# Patient Record
Sex: Male | Born: 1980 | Race: Black or African American | Hispanic: No | Marital: Single | Smoking: Never smoker
Health system: Southern US, Community
[De-identification: ages and names within clinical notes are randomized; demographics above are authoritative.]

## PROBLEM LIST (undated history)

## (undated) DIAGNOSIS — Z789 Other specified health status: Secondary | ICD-10-CM

---

## 2004-09-14 ENCOUNTER — Emergency Department (HOSPITAL_COMMUNITY): Admission: EM | Admit: 2004-09-14 | Discharge: 2004-09-14 | Payer: Self-pay | Admitting: Emergency Medicine

## 2010-11-09 ENCOUNTER — Emergency Department (HOSPITAL_COMMUNITY)
Admission: EM | Admit: 2010-11-09 | Discharge: 2010-11-09 | Payer: Self-pay | Source: Home / Self Care | Admitting: Emergency Medicine

## 2010-11-12 ENCOUNTER — Emergency Department (HOSPITAL_COMMUNITY)
Admission: EM | Admit: 2010-11-12 | Discharge: 2010-11-12 | Payer: Self-pay | Source: Home / Self Care | Admitting: Emergency Medicine

## 2010-11-12 LAB — DIFFERENTIAL
Basophils Absolute: 0 10*3/uL (ref 0.0–0.1)
Basophils Relative: 0 % (ref 0–1)
Eosinophils Absolute: 0.1 10*3/uL (ref 0.0–0.7)
Eosinophils Relative: 0 % (ref 0–5)
Lymphocytes Relative: 10 % — ABNORMAL LOW (ref 12–46)
Lymphs Abs: 1.6 10*3/uL (ref 0.7–4.0)
Monocytes Absolute: 1.5 10*3/uL — ABNORMAL HIGH (ref 0.1–1.0)
Monocytes Relative: 9 % (ref 3–12)
Neutro Abs: 13.8 10*3/uL — ABNORMAL HIGH (ref 1.7–7.7)
Neutrophils Relative %: 81 % — ABNORMAL HIGH (ref 43–77)

## 2010-11-12 LAB — CBC
HCT: 42.8 % (ref 39.0–52.0)
Hemoglobin: 15.5 g/dL (ref 13.0–17.0)
MCH: 32.1 pg (ref 26.0–34.0)
MCHC: 36.2 g/dL — ABNORMAL HIGH (ref 30.0–36.0)
MCV: 88.6 fL (ref 78.0–100.0)
Platelets: 229 10*3/uL (ref 150–400)
RBC: 4.83 MIL/uL (ref 4.22–5.81)
RDW: 11.5 % (ref 11.5–15.5)
WBC: 17.1 10*3/uL — ABNORMAL HIGH (ref 4.0–10.5)

## 2010-12-03 NOTE — Consult Note (Addendum)
NAMEJAMEIL, Spencer Frazier             ACCOUNT NO.:  [id number]  MEDICAL RECORD NO.:  [id number]          PATIENT TYPE:  EMS  LOCATION:  [facility]                         FACILITY:  [facility]  PHYSICIAN:  [name], MD     DATE OF BIRTH:  07/13/1981  DATE OF CONSULTATION:  11/12/2010 DATE OF DISCHARGE:  11/12/2010                                CONSULTATION   REASON FOR CONSULTATION:  Sore throat, possible peritonsillar abscess.  HISTORY:  This is a 30 year old who started having trouble about a week and half ago with sore throat on the left side.  It started getting more severe on the left side on Friday.  About 2 days ago, it started getting a little better on the left and then started getting much worse on the right side.  He came to emergency department, Monday.  He was given a azithromycin and was sent home.  He returned today because he was getting worse.  A CT scan was performed and this revealed findings suspicious for peritonsillar abscess.  No history of abscess prior to this.  He is otherwise in good health, is on no chronic medications and does not smoke.  PHYSICAL EXAMINATION:  GENERAL:  A healthy-appearing young man.  He is very slow to talk because of the sore throat, but when he does talk he is relatively easy to understand.  His voice is not muffled.  He simply is afraid to use his voice and tries whispering which does not work very well.  But when he does make the effort and talk, he is comprehensible. HEENT:  There is no palpable neck masses, but there is a slight fullness and tenderness at the right upper jugular lymph node chain.  There is no external swelling or erythema seen.  Nasal exam unremarkable.  Oral cavity and pharynx reveals large tonsils bilaterally, but asymmetric with much larger tonsil on the right.  The tonsil itself has white exudate on the surface on both sides.  There are deep cryptic spaces seen.  The soft palate is slightly full on the right, but  it is by palpation is nontender and it appears to be larger simply because of the larger tonsil.  Does not feel consistent with an abscess.  CT scan reviewed.  The CT report describes an air-fluid level and an abscess inside the right tonsil, but on more careful inspection, I think this is actually a very deep crypt with some secretions inside as there appears to be an opening that opens up more superiorly.  IMPRESSION:  Acute severe tonsillar pharyngitis.  This may be viral.  I would recommend however we change the antibiotic to a more appropriate upper aerodigestive tract antibiotic such as Augmentin for 10 days.  He is instructed to follow up with me in early next week or sooner if he gets any worse.  I explained to him that if he is unable to drink adequate amounts of liquid to keep himself well-hydrated that he should contact me right away, I will admit him to the hospital for intravenous support.  If he gets any worse, then will check him again to  see if may be an abscess has developed.  Otherwise, he will follow up next week in the office.     [name], MD     JHR/MEDQ  D:  11/12/2010  T:  11/13/2010  Job:  [id number]  Electronically Signed by Serena Colonel MD on 12/03/2010 10:46:30 AM

## 2011-05-23 ENCOUNTER — Emergency Department (HOSPITAL_COMMUNITY): Payer: PRIVATE HEALTH INSURANCE

## 2011-05-23 ENCOUNTER — Inpatient Hospital Stay (HOSPITAL_COMMUNITY)
Admission: EM | Admit: 2011-05-23 | Discharge: 2011-05-25 | DRG: 605 | Disposition: A | Discharge status: Home / Self Care | Payer: PRIVATE HEALTH INSURANCE | Attending: Surgery | Admitting: Surgery

## 2011-05-23 DIAGNOSIS — S41109A Unspecified open wound of unspecified upper arm, initial encounter: Secondary | ICD-10-CM | POA: Diagnosis present

## 2011-05-23 DIAGNOSIS — S01309A Unspecified open wound of unspecified ear, initial encounter: Secondary | ICD-10-CM | POA: Diagnosis present

## 2011-05-23 DIAGNOSIS — S0180XA Unspecified open wound of other part of head, initial encounter: Principal | ICD-10-CM | POA: Diagnosis present

## 2011-05-23 DIAGNOSIS — D62 Acute posthemorrhagic anemia: Secondary | ICD-10-CM | POA: Diagnosis present

## 2011-05-23 LAB — COMPREHENSIVE METABOLIC PANEL
ALT: 29 U/L (ref 0–53)
AST: 35 U/L (ref 0–37)
Albumin: 3.3 g/dL — ABNORMAL LOW (ref 3.5–5.2)
Alkaline Phosphatase: 59 U/L (ref 39–117)
BUN: 8 mg/dL (ref 6–23)
CO2: 25 mEq/L (ref 19–32)
Calcium: 8 mg/dL — ABNORMAL LOW (ref 8.4–10.5)
Chloride: 107 mEq/L (ref 96–112)
Creatinine, Ser: 1.15 mg/dL (ref 0.50–1.35)
GFR calc Af Amer: >60 mL/min (ref >60)
GFR calc non Af Amer: >60 mL/min (ref >60)
Glucose, Bld: 151 mg/dL — ABNORMAL HIGH (ref 70–99)
Potassium: 2.9 mEq/L — ABNORMAL LOW (ref 3.5–5.1)
Sodium: 143 mEq/L (ref 135–145)
Total Bilirubin: 0.2 mg/dL — ABNORMAL LOW (ref 0.3–1.2)
Total Protein: 6.1 g/dL (ref 6.0–8.3)

## 2011-05-23 LAB — CBC
HCT: 34.3 % — ABNORMAL LOW (ref 39.0–52.0)
HCT: 27 % — ABNORMAL LOW (ref 39.0–52.0)
Hemoglobin: 12.4 g/dL — ABNORMAL LOW (ref 13.0–17.0)
Hemoglobin: 9.8 g/dL — ABNORMAL LOW (ref 13.0–17.0)
MCH: 32 pg (ref 26.0–34.0)
MCH: 31.5 pg (ref 26.0–34.0)
MCHC: 36.2 g/dL — ABNORMAL HIGH (ref 30.0–36.0)
MCHC: 36.3 g/dL — ABNORMAL HIGH (ref 30.0–36.0)
MCV: 88.4 fL (ref 78.0–100.0)
MCV: 86.8 fL (ref 78.0–100.0)
Platelets: 184 10*3/uL (ref 150–400)
Platelets: 100 10*3/uL — ABNORMAL LOW (ref 150–400)
RBC: 3.88 MIL/uL — ABNORMAL LOW (ref 4.22–5.81)
RBC: 3.11 MIL/uL — ABNORMAL LOW (ref 4.22–5.81)
RDW: 11.8 % (ref 11.5–15.5)
RDW: 12.6 % (ref 11.5–15.5)
WBC: 7.2 10*3/uL (ref 4.0–10.5)
WBC: 10 10*3/uL (ref 4.0–10.5)

## 2011-05-23 LAB — HEMOGLOBIN: Hemoglobin: 12 g/dL — ABNORMAL LOW (ref 13.0–17.0)

## 2011-05-23 LAB — PROTIME-INR
INR: 1.15 (ref 0.00–1.49)
INR: 1.33 (ref 0.00–1.49)
Prothrombin Time: 14.9 seconds (ref 11.6–15.2)
Prothrombin Time: 16.7 s — ABNORMAL HIGH (ref 11.6–15.2)

## 2011-05-23 LAB — POCT I-STAT, CHEM 8
BUN: 7 mg/dL (ref 6–23)
Calcium, Ion: 1.06 mmol/L — ABNORMAL LOW (ref 1.12–1.32)
Chloride: 106 meq/L (ref 96–112)
Creatinine, Ser: 1.5 mg/dL — ABNORMAL HIGH (ref 0.50–1.35)
Glucose, Bld: 144 mg/dL — ABNORMAL HIGH (ref 70–99)
HCT: 36 % — ABNORMAL LOW (ref 39.0–52.0)
Hemoglobin: 12.2 g/dL — ABNORMAL LOW (ref 13.0–17.0)
Potassium: 3 meq/L — ABNORMAL LOW (ref 3.5–5.1)
Sodium: 146 meq/L — ABNORMAL HIGH (ref 135–145)
TCO2: 21 mmol/L (ref 0–100)

## 2011-05-23 LAB — LACTIC ACID, PLASMA: Lactic Acid, Venous: 5 mmol/L — ABNORMAL HIGH (ref 0.5–2.2)

## 2011-05-23 LAB — APTT: aPTT: 30 s (ref 24–37)

## 2011-05-23 LAB — ABO/RH: ABO/RH(D): O POS

## 2011-05-23 LAB — MRSA PCR SCREENING: MRSA by PCR: NEGATIVE

## 2011-05-23 LAB — HEMATOCRIT: HCT: 32.3 % — ABNORMAL LOW (ref 39.0–52.0)

## 2011-05-24 DIAGNOSIS — H02409 Unspecified ptosis of unspecified eyelid: Secondary | ICD-10-CM

## 2011-05-24 LAB — PREPARE FRESH FROZEN PLASMA
Unit division: 0
Unit division: 0

## 2011-05-24 LAB — CBC
HCT: 26.3 % — ABNORMAL LOW (ref 39.0–52.0)
Hemoglobin: 9.8 g/dL — ABNORMAL LOW (ref 13.0–17.0)
MCH: 31.4 pg (ref 26.0–34.0)
MCHC: 37.3 g/dL — ABNORMAL HIGH (ref 30.0–36.0)
MCV: 84.3 fL (ref 78.0–100.0)
Platelets: 105 10*3/uL — ABNORMAL LOW (ref 150–400)
RBC: 3.12 MIL/uL — ABNORMAL LOW (ref 4.22–5.81)
RDW: 13.5 % (ref 11.5–15.5)
WBC: 11.4 10*3/uL — ABNORMAL HIGH (ref 4.0–10.5)

## 2011-05-24 LAB — BASIC METABOLIC PANEL
BUN: 8 mg/dL (ref 6–23)
CO2: 28 mEq/L (ref 19–32)
Calcium: 8 mg/dL — ABNORMAL LOW (ref 8.4–10.5)
Chloride: 105 mEq/L (ref 96–112)
Creatinine, Ser: 0.91 mg/dL (ref 0.50–1.35)
GFR calc Af Amer: >60 mL/min (ref >60)
GFR calc non Af Amer: >60 mL/min (ref >60)
Glucose, Bld: 119 mg/dL — ABNORMAL HIGH (ref 70–99)
Potassium: 3.8 mEq/L (ref 3.5–5.1)
Sodium: 139 mEq/L (ref 135–145)

## 2011-05-26 LAB — POCT I-STAT 4, (NA,K, GLUC, HGB,HCT)
Glucose, Bld: 160 mg/dL — ABNORMAL HIGH (ref 70–99)
Glucose, Bld: 154 mg/dL — ABNORMAL HIGH (ref 70–99)
HCT: 25 % — ABNORMAL LOW (ref 39.0–52.0)
HCT: 25 % — ABNORMAL LOW (ref 39.0–52.0)
Hemoglobin: 8.5 g/dL — ABNORMAL LOW (ref 13.0–17.0)
Hemoglobin: 8.5 g/dL — ABNORMAL LOW (ref 13.0–17.0)
Potassium: 4 meq/L (ref 3.5–5.1)
Potassium: 4.3 meq/L (ref 3.5–5.1)
Sodium: 145 meq/L (ref 135–145)
Sodium: 144 meq/L (ref 135–145)

## 2011-05-26 LAB — TYPE AND SCREEN
ABO/RH(D): O POS
Antibody Screen: NEGATIVE
Unit division: 0
Unit division: 0
Unit division: 0
Unit division: 0
Unit division: 0
Unit division: 0
Unit division: 0
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
## 2011-06-03 ENCOUNTER — Ambulatory Visit (INDEPENDENT_AMBULATORY_CARE_PROVIDER_SITE_OTHER): Payer: PRIVATE HEALTH INSURANCE | Admitting: Physician Assistant

## 2011-06-03 DIAGNOSIS — S0181XA Laceration without foreign body of other part of head, initial encounter: Secondary | ICD-10-CM

## 2011-06-03 DIAGNOSIS — S0180XA Unspecified open wound of other part of head, initial encounter: Secondary | ICD-10-CM

## 2011-06-03 DIAGNOSIS — S41119A Laceration without foreign body of unspecified upper arm, initial encounter: Secondary | ICD-10-CM

## 2011-06-03 DIAGNOSIS — S41109A Unspecified open wound of unspecified upper arm, initial encounter: Secondary | ICD-10-CM
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
## 2011-07-09 ENCOUNTER — Other Ambulatory Visit (HOSPITAL_COMMUNITY): Payer: Self-pay | Admitting: Otolaryngology

## 2011-07-09 DIAGNOSIS — S0181XA Laceration without foreign body of other part of head, initial encounter: Secondary | ICD-10-CM

## 2011-07-09 DIAGNOSIS — K114 Fistula of salivary gland: Secondary | ICD-10-CM

## 2011-07-13 ENCOUNTER — Ambulatory Visit (HOSPITAL_COMMUNITY)
Admission: RE | Admit: 2011-07-13 | Discharge: 2011-07-13 | Disposition: A | Discharge status: Home / Self Care | Payer: PRIVATE HEALTH INSURANCE | Source: Ambulatory Visit | Attending: Otolaryngology | Admitting: Otolaryngology

## 2011-07-13 DIAGNOSIS — S0181XA Laceration without foreign body of other part of head, initial encounter: Secondary | ICD-10-CM

## 2011-07-13 DIAGNOSIS — K114 Fistula of salivary gland: Secondary | ICD-10-CM

## 2011-07-26 ENCOUNTER — Ambulatory Visit (HOSPITAL_COMMUNITY)
Admission: RE | Admit: 2011-07-26 | Discharge: 2011-07-26 | Disposition: A | Discharge status: Home / Self Care | Payer: PRIVATE HEALTH INSURANCE | Source: Ambulatory Visit | Attending: Otolaryngology | Admitting: Otolaryngology

## 2011-07-26 ENCOUNTER — Other Ambulatory Visit: Payer: Self-pay | Admitting: Otolaryngology

## 2011-07-26 ENCOUNTER — Ambulatory Visit (HOSPITAL_COMMUNITY): Admission: RE | Admit: 2011-07-26 | Payer: PRIVATE HEALTH INSURANCE | Source: Ambulatory Visit

## 2011-07-26 DIAGNOSIS — R519 Headache, unspecified: Secondary | ICD-10-CM

## 2011-07-26 DIAGNOSIS — Z09 Encounter for follow-up examination after completed treatment for conditions other than malignant neoplasm: Secondary | ICD-10-CM

## 2011-07-26 DIAGNOSIS — IMO0002 Reserved for concepts with insufficient information to code with codable children: Secondary | ICD-10-CM

## 2011-07-26 DIAGNOSIS — R51 Headache: Secondary | ICD-10-CM | POA: Insufficient documentation

## 2011-07-26 MED ORDER — IOHEXOL 300 MG/ML  SOLN
3.0000 mL | Freq: Once | INTRAMUSCULAR | Status: AC | PRN
  Administered 2011-07-26: 3 mL

## 2011-08-04 ENCOUNTER — Other Ambulatory Visit (HOSPITAL_COMMUNITY): Payer: PRIVATE HEALTH INSURANCE

## 2012-11-02 ENCOUNTER — Emergency Department (HOSPITAL_COMMUNITY): Payer: BC Managed Care – PPO

## 2012-11-02 ENCOUNTER — Inpatient Hospital Stay (HOSPITAL_COMMUNITY)
Admission: EM | Admit: 2012-11-02 | Discharge: 2012-11-03 | DRG: 765 | Disposition: A | Discharge status: Home / Self Care | Payer: BC Managed Care – PPO | Attending: Emergency Medicine | Admitting: Emergency Medicine

## 2012-11-02 ENCOUNTER — Encounter (HOSPITAL_COMMUNITY): Payer: Self-pay | Admitting: Emergency Medicine

## 2012-11-02 DIAGNOSIS — R0609 Other forms of dyspnea: Secondary | ICD-10-CM | POA: Diagnosis present

## 2012-11-02 DIAGNOSIS — J95821 Acute postprocedural respiratory failure: Secondary | ICD-10-CM

## 2012-11-02 DIAGNOSIS — S0180XA Unspecified open wound of other part of head, initial encounter: Secondary | ICD-10-CM | POA: Diagnosis present

## 2012-11-02 DIAGNOSIS — S01119A Laceration without foreign body of unspecified eyelid and periocular area, initial encounter: Secondary | ICD-10-CM | POA: Diagnosis present

## 2012-11-02 DIAGNOSIS — J9690 Respiratory failure, unspecified, unspecified whether with hypoxia or hypercapnia: Secondary | ICD-10-CM

## 2012-11-02 DIAGNOSIS — IMO0002 Reserved for concepts with insufficient information to code with codable children: Secondary | ICD-10-CM

## 2012-11-02 DIAGNOSIS — S0181XA Laceration without foreign body of other part of head, initial encounter: Secondary | ICD-10-CM | POA: Diagnosis present

## 2012-11-02 DIAGNOSIS — S060X9A Concussion with loss of consciousness of unspecified duration, initial encounter: Principal | ICD-10-CM | POA: Diagnosis present

## 2012-11-02 DIAGNOSIS — R0989 Other specified symptoms and signs involving the circulatory and respiratory systems: Secondary | ICD-10-CM | POA: Diagnosis present

## 2012-11-02 DIAGNOSIS — S0990XA Unspecified injury of head, initial encounter: Secondary | ICD-10-CM

## 2012-11-02 HISTORY — DX: Other specified health status: Z78.9

## 2012-11-02 LAB — BLOOD GAS, ARTERIAL
Acid-Base Excess: 0.6 mmol/L (ref 0.0–2.0)
Bicarbonate: 24.2 mEq/L — ABNORMAL HIGH (ref 20.0–24.0)
Drawn by: 36494
FIO2: 0.5 %
O2 Saturation: 99.3 %
RATE: 16 resp/min
pCO2 arterial: 35.6 mmHg (ref 35.0–45.0)
pO2, Arterial: 170 mmHg — ABNORMAL HIGH (ref 80.0–100.0)

## 2012-11-02 LAB — PROTIME-INR: Prothrombin Time: 12.9 seconds (ref 11.6–15.2)

## 2012-11-02 LAB — CBC
Hemoglobin: 15.4 g/dL (ref 13.0–17.0)
MCHC: 34.5 g/dL (ref 30.0–36.0)
RBC: 5 MIL/uL (ref 4.22–5.81)

## 2012-11-02 LAB — URINALYSIS, MICROSCOPIC ONLY
Glucose, UA: NEGATIVE mg/dL
Leukocytes, UA: NEGATIVE
Nitrite: NEGATIVE
Protein, ur: NEGATIVE mg/dL

## 2012-11-02 LAB — COMPREHENSIVE METABOLIC PANEL
ALT: 36 U/L (ref 0–53)
CO2: 24 mEq/L (ref 19–32)
Calcium: 9.2 mg/dL (ref 8.4–10.5)
Creatinine, Ser: 0.92 mg/dL (ref 0.50–1.35)
GFR calc Af Amer: >90 mL/min (ref >90)
GFR calc non Af Amer: >90 mL/min (ref >90)
Glucose, Bld: 126 mg/dL — ABNORMAL HIGH (ref 70–99)

## 2012-11-02 LAB — POCT I-STAT, CHEM 8
BUN: 10 mg/dL (ref 6–23)
Chloride: 106 mEq/L (ref 96–112)
Creatinine, Ser: 1.4 mg/dL — ABNORMAL HIGH (ref 0.50–1.35)
Potassium: 3.8 mEq/L (ref 3.5–5.1)
Sodium: 147 mEq/L — ABNORMAL HIGH (ref 135–145)

## 2012-11-02 MED ORDER — MIDAZOLAM HCL 5 MG/5ML IJ SOLN
INTRAMUSCULAR | Status: AC | PRN
  Administered 2012-11-02 (×2): 2 mg via INTRAVENOUS

## 2012-11-02 MED ORDER — SUCCINYLCHOLINE CHLORIDE 20 MG/ML IJ SOLN
INTRAMUSCULAR | Status: AC | PRN
  Administered 2012-11-02: 120 mg via INTRAVENOUS

## 2012-11-02 MED ORDER — LIDOCAINE HCL (CARDIAC) 20 MG/ML IV SOLN
INTRAVENOUS | Status: AC | PRN
  Administered 2012-11-02: 100 mg via INTRAVENOUS

## 2012-11-02 MED ORDER — FENTANYL CITRATE 0.05 MG/ML IJ SOLN
INTRAMUSCULAR | Status: DC | PRN
  Administered 2012-11-02 (×2): 100 ug via INTRAVENOUS

## 2012-11-02 MED ORDER — FENTANYL BOLUS VIA INFUSION
25.0000 ug | Freq: Four times a day (QID) | INTRAVENOUS | Status: DC | PRN
  Administered 2012-11-02: 50 ug
  Filled 2012-11-02: qty 100

## 2012-11-02 MED ORDER — SODIUM CHLORIDE 0.9 % IV SOLN: 1.0000 mg/h | INTRAVENOUS | Status: DC

## 2012-11-02 MED ORDER — MIDAZOLAM BOLUS VIA INFUSION
1.0000 mg | INTRAVENOUS | Status: DC | PRN
  Administered 2012-11-02: 2 mg

## 2012-11-02 MED ORDER — ONDANSETRON HCL 4 MG PO TABS: 4.0000 mg | ORAL_TABLET | Freq: Four times a day (QID) | ORAL | Status: DC | PRN

## 2012-11-02 MED ORDER — SODIUM CHLORIDE 0.9 % IV SOLN
INTRAVENOUS | Status: AC | PRN
  Administered 2012-11-02: 1000 mL via INTRAVENOUS
  Administered 2012-11-02: 10 mL via INTRAVENOUS
  Administered 2012-11-02: 1000 mL via INTRAVENOUS

## 2012-11-02 MED ORDER — PANTOPRAZOLE SODIUM 40 MG IV SOLR
40.0000 mg | Freq: Every day | INTRAVENOUS | Status: DC
  Administered 2012-11-02: 40 mg via INTRAVENOUS
  Filled 2012-11-02: qty 40

## 2012-11-02 MED ORDER — ONDANSETRON HCL 4 MG/2ML IJ SOLN: 4.0000 mg | Freq: Four times a day (QID) | INTRAMUSCULAR | Status: DC | PRN

## 2012-11-02 MED ORDER — SODIUM CHLORIDE 0.9 % IV SOLN
INTRAVENOUS | Status: DC
  Administered 2012-11-02: 07:00:00 via INTRAVENOUS

## 2012-11-02 MED ORDER — SODIUM CHLORIDE 0.9 % IJ SOLN: 3.0000 mL | INTRAMUSCULAR | Status: DC | PRN

## 2012-11-02 MED ORDER — SODIUM CHLORIDE 0.9 % IV SOLN
25.0000 ug/h | INTRAVENOUS | Status: DC
  Administered 2012-11-02 (×2): 50 ug/h via INTRAVENOUS
  Administered 2012-11-02: 100 ug/h via INTRAVENOUS
  Filled 2012-11-02: qty 50

## 2012-11-02 MED ORDER — ETOMIDATE 2 MG/ML IV SOLN
INTRAVENOUS | Status: AC | PRN
  Administered 2012-11-02: 20 mg via INTRAVENOUS

## 2012-11-02 MED ORDER — MIDAZOLAM HCL 5 MG/ML IJ SOLN
1.0000 mg/h | INTRAMUSCULAR | Status: DC
  Administered 2012-11-02: 2 mg/h via INTRAVENOUS
  Administered 2012-11-02: 1 mg/h via INTRAVENOUS
  Filled 2012-11-02: qty 10

## 2012-11-02 MED ORDER — MIDAZOLAM BOLUS VIA INFUSION
1.0000 mg | INTRAVENOUS | Status: DC | PRN
  Filled 2012-11-02: qty 2

## 2012-11-02 MED ORDER — PANTOPRAZOLE SODIUM 40 MG PO TBEC
40.0000 mg | DELAYED_RELEASE_TABLET | Freq: Every day | ORAL | Status: DC
  Administered 2012-11-03: 40 mg via ORAL
  Filled 2012-11-02: qty 1

## 2012-11-02 MED ORDER — SODIUM CHLORIDE 0.9 % IV SOLN
25.0000 ug/h | INTRAVENOUS | Status: DC
  Filled 2012-11-02: qty 50

## 2012-11-02 MED ORDER — OXYCODONE-ACETAMINOPHEN 5-325 MG PO TABS
1.0000 | ORAL_TABLET | ORAL | Status: DC | PRN
  Administered 2012-11-03: 1 via ORAL
  Filled 2012-11-02: qty 2

## 2012-11-02 NOTE — Procedures (Signed)
Facial laceration repair  Indications:  Clinically significant bleeding with lacerations  Pre-operative Diagnosis: hemorrhage from facial lacerations  Post-operative Diagnosis: saa  Procedure Details  There was no informed consent due to patient being intubated and no family members available.  The lacerations were bleeding briskly so I needed to repair.  I did not think ok to wait for ent/face to come in either.  After sterile skin prep and washout of wounds, I trimmed some dead edges.  There were two lacerations superior to the right orbit.  One was 1.5 cm and the other was a 3 cm stellate laceration with abrasion around it. I repaired the 1.5 cm laceration with interrupted 4-0 prolene after infiltration with lidocaine.  I then trimmed the other wound and repaired the deep layer with 4-0 vicryl and the skin with 4-0 prolene.  There is fairly large hematoma underlying this that was present prior to repair. The lower lid lesion is about 3.5 cm and is about 6 mm from edge of lid.  This was actively bleeding.  That patient is 94 degrees right now also.  I repaired the deep layer with vicryl.  This did not enter into globe. There were two active bleeders that I oversewed.  I then approximated the skin with prolene.  I was concerned this would change the geometry of the lower lid but I think it will be reasonable.    Estimated Blood Loss:  50 cc         Specimens:  None              Complications:  None; patient tolerated the procedure well.         Disposition: ICU - intubated and hemodynamically stable.         Condition: stable

## 2012-11-02 NOTE — Progress Notes (Signed)
UR completed 

## 2012-11-02 NOTE — Progress Notes (Signed)
Follow up - Trauma and Critical Care  Patient Details:    Spencer Frazier is an 31 y.o. male.  Lines/tubes : Airway 7.5 mm (Active)  Secured at (cm) 24 cm 11/02/2012  7:30 AM  Measured From Teeth 11/02/2012  7:30 AM  Secured Location Right 11/02/2012  7:30 AM  Secured By [employer] 11/02/2012  7:30 AM  Tube Holder Repositioned Yes 11/02/2012  7:30 AM  Site Condition Cool;Dry 11/02/2012  7:30 AM     NG/OG Tube Orogastric 16 Fr. Center mouth (Active)  Placement Verification Auscultation;Xray 11/02/2012  8:00 AM  Site Assessment Clean;Intact 11/02/2012  8:00 AM  Status Clamped 11/02/2012  8:00 AM  Gastric Residual 0 mL 11/02/2012  8:00 AM     Urethral Catheter Latex 16 Fr. (Active)  Indication for Insertion or Continuance of Catheter Prolonged immobilization 11/02/2012  8:00 AM  Site Assessment Clean;Intact 11/02/2012  8:00 AM  Collection Container Standard drainage bag 11/02/2012  8:00 AM  Securement Method Leg strap 11/02/2012  8:00 AM    Microbiology/Sepsis markers: Results for orders placed during the hospital encounter of 11/02/12  MRSA PCR SCREENING     Status: Normal   Collection Time   11/02/12  6:38 AM      Component Value Range Status Comment   MRSA by PCR NEGATIVE  NEGATIVE Final     Anti-infectives:  Anti-infectives    None      Best Practice/Protocols:  VTE Prophylaxis: Mechanical GI Prophylaxis: Proton Pump Inhibitor None currently but may be placed on alcohol withdrawal protocol  Consults:      Events:  Subjective:    Overnight Issues: Just came to the ICU abotu two hours ago.  Objective:  Vital signs for last 24 hours: Temp:  [94.3 F (34.6 C)-99 F (37.2 C)] 99 F (37.2 C) (12/26 0800) Pulse Rate:  [69-109] 83  (12/26 0800) Resp:  [16-25] 16  (12/26 0800) BP: (110-197)/(70-116) 135/81 mmHg (12/26 0800) SpO2:  [78 %-100 %] 100 % (12/26 0800) FiO2 (%):  [50 %] 50 % (12/26 0800)  Hemodynamic parameters for last 24  hours:    Intake/Output from previous day: 12/25 0701 - 12/26 0700 In: 4805.9 [I.V.:4805.9] Out: 160 [Urine:160]  Intake/Output this shift: Total I/O In: 100 [I.V.:100] Out: 125 [Urine:125]  Vent settings for last 24 hours: Vent Mode:  [-] PRVC FiO2 (%):  [50 %] 50 % Set Rate:  [16 bmp] 16 bmp Vt Set:  [550 mL] 550 mL PEEP:  [5 cmH20] 5 cmH20 Plateau Pressure:  [14 cmH20] 14 cmH20  Physical Exam:  General: no respiratory distress and not alert but responsive. Neuro: oriented, nonfocal exam and RASS -1 HEENT/Neck: ETT WNL , PERRL and right infra-orbital laceration with likely skin loss and slightly protuberant. Resp: clear to auscultation bilaterally CVS: regular rate and rhythm, S1, S2 normal, no murmur, click, rub or gallop GI: soft, nontender, BS WNL, no r/g and patient states that abdomen is slightly tender. Extremities: no edema, no erythema, pulses WNL  Results for orders placed during the hospital encounter of 11/02/12 (from the past 24 hour(s))  SAMPLE TO BLOOD BANK     Status: Normal   Collection Time   11/02/12  2:45 AM      Component Value Range   Blood Bank Specimen SAMPLE AVAILABLE FOR TESTING     Sample Expiration 11/03/2012    CDS SEROLOGY     Status: Normal   Collection Time   11/02/12  2:46 AM  Component Value Range   CDS serology specimen       Value: SPECIMEN WILL BE HELD FOR 14 DAYS IF TESTING IS REQUIRED  COMPREHENSIVE METABOLIC PANEL     Status: Abnormal   Collection Time   11/02/12  2:46 AM      Component Value Range   Sodium 142  135 - 145 mEq/L   Potassium 3.9  3.5 - 5.1 mEq/L   Chloride 103  96 - 112 mEq/L   CO2 24  19 - 32 mEq/L   Glucose, Bld 126 (*) 70 - 99 mg/dL   BUN 11  6 - 23 mg/dL   Creatinine, Ser 1.61  0.50 - 1.35 mg/dL   Calcium 9.2  8.4 - 09.6 mg/dL   Total Protein 8.0  6.0 - 8.3 g/dL   Albumin 4.2  3.5 - 5.2 g/dL   AST 63 (*) 0 - 37 U/L   ALT 36  0 - 53 U/L   Alkaline Phosphatase 79  39 - 117 U/L   Total Bilirubin  0.3  0.3 - 1.2 mg/dL   GFR calc non Af Amer >90  >90 mL/min   GFR calc Af Amer >90  >90 mL/min  CBC     Status: Normal   Collection Time   11/02/12  2:46 AM      Component Value Range   WBC 6.9  4.0 - 10.5 K/uL   RBC 5.00  4.22 - 5.81 MIL/uL   Hemoglobin 15.4  13.0 - 17.0 g/dL   HCT 04.5  40.9 - 81.1 %   MCV 89.2  78.0 - 100.0 fL   MCH 30.8  26.0 - 34.0 pg   MCHC 34.5  30.0 - 36.0 g/dL   RDW 91.4  78.2 - 95.6 %   Platelets 194  150 - 400 K/uL  PROTIME-INR     Status: Normal   Collection Time   11/02/12  2:46 AM      Component Value Range   Prothrombin Time 12.9  11.6 - 15.2 seconds   INR 0.98  0.00 - 1.49  CG4 I-STAT (LACTIC ACID)     Status: Abnormal   Collection Time   11/02/12  2:59 AM      Component Value Range   Lactic Acid, Venous 5.91 (*) 0.5 - 2.2 mmol/L  POCT I-STAT, CHEM 8     Status: Abnormal   Collection Time   11/02/12  3:01 AM      Component Value Range   Sodium 147 (*) 135 - 145 mEq/L   Potassium 3.8  3.5 - 5.1 mEq/L   Chloride 106  96 - 112 mEq/L   BUN 10  6 - 23 mg/dL   Creatinine, Ser 2.13 (*) 0.50 - 1.35 mg/dL   Glucose, Bld 086 (*) 70 - 99 mg/dL   Calcium, Ion 5.78  4.69 - 1.23 mmol/L   TCO2 26  0 - 100 mmol/L   Hemoglobin 16.3  13.0 - 17.0 g/dL   HCT 62.9  52.8 - 41.3 %  URINALYSIS, MICROSCOPIC ONLY     Status: Abnormal   Collection Time   11/02/12  4:05 AM      Component Value Range   Color, Urine YELLOW  YELLOW   APPearance CLEAR  CLEAR   Specific Gravity, Urine 1.013  1.005 - 1.030   pH 6.5  5.0 - 8.0   Glucose, UA NEGATIVE  NEGATIVE mg/dL   Hgb urine dipstick TRACE (*) NEGATIVE   Bilirubin Urine NEGATIVE  NEGATIVE   Ketones, ur NEGATIVE  NEGATIVE mg/dL   Protein, ur NEGATIVE  NEGATIVE mg/dL   Urobilinogen, UA 0.2  0.0 - 1.0 mg/dL   Nitrite NEGATIVE  NEGATIVE   Leukocytes, UA NEGATIVE  NEGATIVE   RBC / HPF 0-2  <3 RBC/hpf   Bacteria, UA RARE  RARE   Squamous Epithelial / LPF RARE  RARE  MRSA PCR SCREENING     Status: Normal    Collection Time   11/02/12  6:38 AM      Component Value Range   MRSA by PCR NEGATIVE  NEGATIVE  BLOOD GAS, ARTERIAL     Status: Abnormal   Collection Time   11/02/12  7:00 AM      Component Value Range   FIO2 0.50     Delivery systems VENTILATOR     Mode PRESSURE REGULATED VOLUME CONTROL     VT 550     Rate 16     Peep/cpap 5.0     pH, Arterial 7.447  7.350 - 7.450   pCO2 arterial 35.6  35.0 - 45.0 mmHg   pO2, Arterial 170.0 (*) 80.0 - 100.0 mmHg   Bicarbonate 24.2 (*) 20.0 - 24.0 mEq/L   TCO2 25.3  0 - 100 mmol/L   Acid-Base Excess 0.6  0.0 - 2.0 mmol/L   O2 Saturation 99.3     Patient temperature 98.6     Collection site RIGHT RADIAL     Drawn by [phone]     Sample type ARTERIAL DRAW     Allens test (pass/fail) PASS  PASS     Assessment/Plan:   NEURO  Altered Mental Status:  concussion and awakening   Plan: Hold sedation as we try to get the patient extubated  PULM  No issues   Plan: CPM  CARDIO  No issues   Plan: CPM  RENAL  No issues   Plan: CPM  GI  Soft, but mildly tender.   Plan: Evaluated after extubation  ID  No apparent issues   Plan: CPM  HEME  No new issues   Plan: CPM  ENDO CPM   Plan: CPM  Global Issues  Wean to extubate and re-evaluate.    LOS: 0 days   Additional comments:Patient just up from the ED.  Will attempt to wean ASAP.  Critical Care Total Time*: 15 Minutes  Shirlyn Savin O 11/02/2012  *Care during the described time interval was provided by me and/or other providers on the critical care team.  I have reviewed this patient's available data, including medical history, events of note, physical examination and test results as part of my evaluation.
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
## 2012-11-02 NOTE — ED Notes (Signed)
Pt in radiology with RN accompanyment.
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
## 2012-11-02 NOTE — Progress Notes (Signed)
Wasted fentanyl and versed in sink since pt is no longer intubated. Wasted 20cc of versed and of fentanyl. Verified with another RN Verneda Skill.

## 2012-11-02 NOTE — Procedures (Signed)
Extubation Procedure Note  Patient Details:   Name: Spencer Frazier DOB: 10-14-1981 MRN: [id number]   Airway Documentation:     Evaluation  O2 sats: stable throughout Complications: No apparent complications Patient did tolerate procedure well. Bilateral Breath Sounds: Clear   Yes  Pt extubated to RA.  Pt Spo2 is 97%.  NIF -25, FVC 2.4L.  Cuff leak noted.  Pt has good cough and able to vocalize.  Pt has good cough.  Will continue to monitor.    Genia Del Apple 11/02/2012, 11:44 AM

## 2012-11-03 ENCOUNTER — Encounter (HOSPITAL_COMMUNITY): Payer: Self-pay

## 2012-11-03 DIAGNOSIS — S060X9A Concussion with loss of consciousness of unspecified duration, initial encounter: Secondary | ICD-10-CM | POA: Diagnosis present

## 2012-11-03 DIAGNOSIS — S060XAA Concussion with loss of consciousness status unknown, initial encounter: Secondary | ICD-10-CM | POA: Diagnosis present

## 2012-11-03 DIAGNOSIS — S0181XA Laceration without foreign body of other part of head, initial encounter: Secondary | ICD-10-CM | POA: Diagnosis present

## 2012-11-03 LAB — BASIC METABOLIC PANEL
BUN: 10 mg/dL (ref 6–23)
Chloride: 103 mEq/L (ref 96–112)
Creatinine, Ser: 0.83 mg/dL (ref 0.50–1.35)
GFR calc Af Amer: >90 mL/min (ref >90)
Glucose, Bld: 103 mg/dL — ABNORMAL HIGH (ref 70–99)
Potassium: 3.6 mEq/L (ref 3.5–5.1)

## 2012-11-03 LAB — CBC
Hemoglobin: 14.2 g/dL (ref 13.0–17.0)
MCV: 88.9 fL (ref 78.0–100.0)
Platelets: 163 10*3/uL (ref 150–400)
RBC: 4.51 MIL/uL (ref 4.22–5.81)
WBC: 6.9 10*3/uL (ref 4.0–10.5)

## 2012-11-03 MED ORDER — TRAMADOL HCL 50 MG PO TABS: 50.0000 mg | ORAL_TABLET | Freq: Four times a day (QID) | ORAL | Status: DC | PRN

## 2012-11-03 MED ORDER — NAPROXEN 500 MG PO TABS: 500.0000 mg | ORAL_TABLET | Freq: Two times a day (BID) | ORAL | Status: DC
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
## 2012-11-03 NOTE — Progress Notes (Signed)
Agree  With above  Home today

## 2012-11-09 ENCOUNTER — Ambulatory Visit (INDEPENDENT_AMBULATORY_CARE_PROVIDER_SITE_OTHER): Payer: BC Managed Care – PPO | Admitting: Orthopedic Surgery

## 2012-11-09 ENCOUNTER — Encounter (INDEPENDENT_AMBULATORY_CARE_PROVIDER_SITE_OTHER): Payer: Self-pay

## 2012-11-09 VITALS — BP 136/84 | HR 80 | Resp 16 | Ht 65.0 in | Wt 146.6 lb

## 2012-11-09 DIAGNOSIS — S0181XA Laceration without foreign body of other part of head, initial encounter: Secondary | ICD-10-CM

## 2012-11-09 DIAGNOSIS — S0180XA Unspecified open wound of other part of head, initial encounter: Secondary | ICD-10-CM

## 2012-11-09 DIAGNOSIS — M549 Dorsalgia, unspecified: Secondary | ICD-10-CM

## 2012-11-09 DIAGNOSIS — S6990XA Unspecified injury of unspecified wrist, hand and finger(s), initial encounter: Secondary | ICD-10-CM

## 2012-11-09 DIAGNOSIS — S6992XA Unspecified injury of left wrist, hand and finger(s), initial encounter: Secondary | ICD-10-CM
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
## 2012-11-10 ENCOUNTER — Ambulatory Visit (INDEPENDENT_AMBULATORY_CARE_PROVIDER_SITE_OTHER): Payer: BC Managed Care – PPO

## 2012-11-10 DIAGNOSIS — Z4802 Encounter for removal of sutures: Secondary | ICD-10-CM
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
## 2012-11-17 ENCOUNTER — Telehealth (HOSPITAL_COMMUNITY): Payer: Self-pay | Admitting: Orthopedic Surgery

## 2012-11-20 ENCOUNTER — Telehealth (HOSPITAL_COMMUNITY): Payer: Self-pay | Admitting: Orthopedic Surgery

## 2012-11-20 NOTE — Telephone Encounter (Signed)
Called 2 times, was told by person who answered that I had the wrong number.  Pt will need to call back with correct number.

## 2012-11-20 NOTE — Telephone Encounter (Signed)
Has another suture under eye. Will have patient come in this Thursday.

## 2012-11-23 ENCOUNTER — Ambulatory Visit (INDEPENDENT_AMBULATORY_CARE_PROVIDER_SITE_OTHER): Payer: BC Managed Care – PPO | Admitting: Orthopedic Surgery

## 2012-11-23 ENCOUNTER — Encounter (INDEPENDENT_AMBULATORY_CARE_PROVIDER_SITE_OTHER): Payer: Self-pay

## 2012-11-23 VITALS — BP 132/74 | HR 64 | Temp 97.8°F | Resp 16 | Ht 65.0 in | Wt 142.0 lb

## 2012-11-23 DIAGNOSIS — S0180XA Unspecified open wound of other part of head, initial encounter: Secondary | ICD-10-CM

## 2012-11-23 DIAGNOSIS — S6990XA Unspecified injury of unspecified wrist, hand and finger(s), initial encounter: Secondary | ICD-10-CM

## 2012-11-23 DIAGNOSIS — S0181XA Laceration without foreign body of other part of head, initial encounter: Secondary | ICD-10-CM

## 2012-11-23 DIAGNOSIS — S6992XA Unspecified injury of left wrist, hand and finger(s), initial encounter: Secondary | ICD-10-CM
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
## 2013-09-13 ENCOUNTER — Other Ambulatory Visit: Payer: Self-pay

## 2014-09-27 ENCOUNTER — Other Ambulatory Visit: Payer: Self-pay | Admitting: Emergency Medicine

## 2014-09-30 LAB — MEASLES/MUMPS/RUBELLA IMMUNITY
MUMPS IGG: 125 [AU]/ml — AB (ref <9.00)
RUBELLA: 12.9 {index} — AB (ref <0.90)
Rubeola IgG: 187 AU/mL — ABNORMAL HIGH (ref <25.00)
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
## 2016-03-26 ENCOUNTER — Ambulatory Visit (INDEPENDENT_AMBULATORY_CARE_PROVIDER_SITE_OTHER): Payer: BLUE CROSS/BLUE SHIELD | Admitting: Physician Assistant

## 2016-03-26 VITALS — BP 122/72 | HR 77 | Temp 98.3°F | Resp 17 | Ht 64.5 in | Wt 148.0 lb

## 2016-03-26 DIAGNOSIS — Z111 Encounter for screening for respiratory tuberculosis: Secondary | ICD-10-CM | POA: Diagnosis not present

## 2016-03-26 NOTE — Progress Notes (Signed)
  Tuberculosis Risk Questionnaire  1. NO  Were you born outside the [place] in one of the following parts of the world: [place], [place], [place], [place] or [place]?    2. No Have you traveled outside the [place] and lived for more than one month in one of the following parts of the world: [place], [place], [place], [place] or [place]?    3. No Do you have a compromised immune system such as from any of the following conditions:HIV/AIDS, organ or bone marrow transplantation, diabetes, immunosuppressive medicines (e.g. Prednisone, Remicaide), leukemia, lymphoma, cancer of the head or neck, gastrectomy or jejunal bypass, end-stage renal disease (on dialysis), or silicosis?     4. Yes MENTAL HEALTH Have you ever or do you plan on working in: a residential care center, a health care facility, a jail or prison or homeless shelter?    5. No Have you ever: injected illegal drugs, used crack cocaine, lived in a homeless shelter  or been in jail or prison?     6. No Have you ever been exposed to anyone with infectious tuberculosis?    Tuberculosis Symptom Questionnaire  Do you currently have any of the following symptoms?  1. No Unexplained cough lasting more than 3 weeks?   2. No Unexplained fever lasting more than 3 weeks.   3. No Night Sweats (sweating that leaves the bedclothes and sheets wet)     4. No Shortness of Breath   5. No Chest Pain   6. No Unintentional weight loss    7. No Unexplained fatigue (very tired for no reason)

## 2016-03-26 NOTE — Patient Instructions (Addendum)
Be sure to come to clinic Monday morning prior to 11am so we can check the site.   Tuberculin Skin Test WHY AM I HAVING THIS TEST? Tuberculosis (TB) is a bacterial infection caused by Mycobacterium tuberculosis. Most people who are exposed to these bacteria have a strong enough defense (immune) system to prevent the bacteria from causing TB and developing symptoms. Their bodies prevent the germs from being active and making them sick (latent TB infection).  However, if you have TB germs in your body and your immune system is weak, you can develop a TB infection. This can cause symptoms such as:   Night sweats.  Fever.  Weakness.  Weight loss. A latent TB infection can also become active later in life if your immune system becomes weakened or compromised. You may have this test if your health care provider suspects that you have TB. You may also have this test to screen for TB if you are at risk for getting the disease. Those at increased risk include:  People who inject illegal drugs or share needles.  People with HIV or other diseases that affect immunity.  Health care workers.  People who live in high-risk communities, such as homeless shelters, nursing homes, and correctional facilities.  People who have been in contact with someone with TB.  People from countries where TB is more common. If you are in a high-risk group, your health care provider may wish to screen for TB more often. This can help prevent the spread of the disease. Sometimes TB screening is required when starting a new job, such as becoming a Scientist, forensichealth care worker or a Runner, broadcasting/film/videoteacher. Colleges or universities may require it of new students. HOW WILL I BE TESTED? A tuberculin skin test is the main test used to check for exposure to the bacteria that can cause TB. The test checks for antibodies to the bacteria. Antibodies are proteins that your body produces to protect you from germs and other things that can make you  sick. Your health care provider will inject a solution known as PPD (purified protein derivative) under the first layer of skin on your arm. This causes a blister-like bubble to form at the site. Your health care provider will then examine the site after a number of hours have passed to see if a reaction has occurred. HOW DO I PREPARE FOR THE TEST? There is no preparation required for this test. WHAT DO THE RESULTS MEAN? Your test results will be reported as either negative or positive.  If the tuberculin skin test produces a negative result, it is likely that you do not have TB and have not been exposed to the TB bacteria. If you or your health care provider suspects exposure, however, you may want to repeat the test a few weeks later. A blood test may also be used to check for TB. This is because you will not react to the tuberculin skin test until several weeks after exposure to TB bacteria. If you test positive to the tuberculin skin test, it is likely that you have been exposed to TB bacteria. The test does not distinguish between an active and a latent TB infection. A false-positive result can occur. A false-positive result for TB bacteria is incorrect because it indicates a condition or finding is present when it is not. Talk to your health care provider to discuss your results, treatment options, and if necessary, the need for more tests. It is your responsibility to obtain your test results.  Ask the lab or department performing the test when and how you will get your results. Talk with your health care provider if you have any questions about your results.   This information is not intended to replace advice given to you by your health care provider. Make sure you discuss any questions you have with your health care provider.   Document Released: 08/04/2005 Document Revised: 11/15/2014 Document Reviewed: 02/18/2014 Elsevier Interactive Patient Education [employer]

## 2016-03-26 NOTE — Progress Notes (Signed)
   Subjective:    Patient ID: Spencer Frazier, male    DOB: [birth year], 35 y.o.   MRN: [id number]  Chief Complaint  Patient presents with  . Immunizations    TB    Medications, allergies, past medical history, surgical history, family history, social history and problem list reviewed and updated.  HPI  [age] yom presents needing TB test placed for work.   Denies hx TB. Denies known exposure. 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   Review of Systems No fevers, chills, cough.     Objective:   Physical Exam  Constitutional: He is oriented to person, place, and time. He appears well-developed and well-nourished.  Non-toxic appearance. He does not have a sickly appearance. He does not appear ill. No distress.  BP 122/72 mmHg  Pulse 77  Temp(Src) 98.3 F (36.8 C) (Oral)  Resp 17  Ht 5' 4.5" (1.638 m)  Wt 148 lb (67.132 kg)  BMI 25.02 kg/m2  SpO2 98%   Neurological: He is alert and oriented to person, place, and time.  Psychiatric: He has a normal mood and affect. His speech is normal and behavior is normal.      Assessment & Plan:   Visit for TB skin test - Plan: TB Skin Test --ppd placed today, rtc 48-72 hrs   Donnajean Lopesodd M. Raylen Tangonan, PA-C Physician Assistant-Certified [facility]  03/26/2016 11:54 AM

## 2016-03-26 NOTE — Progress Notes (Signed)
  Tuberculosis Risk Questionnaire  1. No Were you born outside the [place] in one of the following parts of the world: [place], [place], [place], [place] or [place]?    2. No Have you traveled outside the [place] and lived for more than one month in one of the following parts of the world: [place], [place], [place], [place] or [place]?    3. No Do you have a compromised immune system such as from any of the following conditions:HIV/AIDS, organ or bone marrow transplantation, diabetes, immunosuppressive medicines (e.g. Prednisone, Remicaide), leukemia, lymphoma, cancer of the head or neck, gastrectomy or jejunal bypass, end-stage renal disease (on dialysis), or silicosis?     4. Yes Healthcare facility-[facility] Have you ever or do you plan on working in: a residential care center, a health care facility, a jail or prison or homeless shelter?    5. No Have you ever: injected illegal drugs, used crack cocaine, lived in a homeless shelter  or been in jail or prison?     6. No Have you ever been exposed to anyone with infectious tuberculosis?    Tuberculosis Symptom Questionnaire  Do you currently have any of the following symptoms?  1. No Unexplained cough lasting more than 3 weeks?   2. No Unexplained fever lasting more than 3 weeks.   3. No Night Sweats (sweating that leaves the bedclothes and sheets wet)     4. No Shortness of Breath   5. No Chest Pain   6. No Unintentional weight loss    7. No Unexplained fatigue (very tired for no reason)

## 2016-03-29 ENCOUNTER — Ambulatory Visit (INDEPENDENT_AMBULATORY_CARE_PROVIDER_SITE_OTHER): Payer: BLUE CROSS/BLUE SHIELD

## 2016-03-29 DIAGNOSIS — Z111 Encounter for screening for respiratory tuberculosis: Secondary | ICD-10-CM

## 2016-03-29 LAB — TB SKIN TEST: TB SKIN TEST: NEGATIVE

## 2018-10-12 NOTE — Progress Notes (Signed)
Test read
